# Patient Record
Sex: Male | Born: 1995 | Hispanic: Refuse to answer | Marital: Single | Smoking: Never smoker
Health system: Southern US, Community
[De-identification: ages and names within clinical notes are randomized; demographics above are authoritative.]

---

## 2016-03-10 ENCOUNTER — Ambulatory Visit (INDEPENDENT_AMBULATORY_CARE_PROVIDER_SITE_OTHER): Payer: 59 | Admitting: Family Medicine

## 2016-03-10 ENCOUNTER — Encounter: Payer: Self-pay | Admitting: Family Medicine

## 2016-03-10 VITALS — BP 127/60 | HR 62 | Temp 97.5°F | Resp 14

## 2016-03-10 DIAGNOSIS — J069 Acute upper respiratory infection, unspecified: Secondary | ICD-10-CM

## 2016-03-10 MED ORDER — AZITHROMYCIN 250 MG PO TABS: ORAL_TABLET | ORAL | 0 refills | Status: AC

## 2016-03-10 NOTE — Progress Notes (Signed)
Patient presents today with symptoms of yellow-green mucus from his chest. Patient has been coughing for about the last week. He denies any significant sinus congestion. He denies any fever, chest pain, shortness of breath, severe headache. He has been taking a cough suppressant at bedtime.  ROS: Negative except mentioned above.  Vitals as per Epic.  GENERAL: NAD HEENT: mild pharyngeal erythema, no exudate, no erythema of TMs, no cervical LAD RESP: CTA B CARD: RRR NEURO: CN II-XII grossly intact   A/P: URI - will treat with Z-Pak, Delsym when necessary, Claritin when necessary, rest, hydration, seek medical attention if symptoms persist or worsen as discussed. No athletic activity afebrile.

## 2016-05-19 ENCOUNTER — Encounter: Payer: Self-pay | Admitting: Family Medicine

## 2016-05-19 ENCOUNTER — Ambulatory Visit (INDEPENDENT_AMBULATORY_CARE_PROVIDER_SITE_OTHER): Payer: 59 | Admitting: Family Medicine

## 2016-05-19 DIAGNOSIS — M898X6 Other specified disorders of bone, lower leg: Secondary | ICD-10-CM

## 2016-05-19 DIAGNOSIS — M79662 Pain in left lower leg: Secondary | ICD-10-CM

## 2016-05-19 NOTE — Progress Notes (Signed)
Patient presents today with symptoms of left knee pain. Patient states that he has had the symptoms for about 6-8 weeks. He states that he started to notice some soreness in the area at that time. He denies any injury to the site. This happened while he was playing soccer. He describes it as a soreness that he feels over the area the next day after doing activity. He was able to complete the soccer season and is still training now. He denies there ever being any bruising or swelling of the area. He denies having pain anywhere else. He has taken Ibuprofen for the symptoms.  ROS: Negative except mentioned above. Vitals as per Epic.  GENERAL: NAD MSK: Left knee - no effusion appreciated, full range of motion, no joint line tenderness, negative Lachman negative drawer, no significant laxity with varus or valgus stress, tenderness on the proximal aspect of the fibula, mild tenderness at hamstring attachment attachment at the fibula, no tenderness in the muscle belly of the hamstring NEURO: CN II-XII grossly intact   A/P: Left knee pain - appears to be at proximal aspect of fibula, will do x-rays, given length of time will do MRI as well, will talk with athletic trainer as well.

## 2016-05-22 ENCOUNTER — Ambulatory Visit
Admission: RE | Admit: 2016-05-22 | Discharge: 2016-05-22 | Disposition: A | Discharge status: Home / Self Care | Payer: 59 | Source: Ambulatory Visit | Attending: Family Medicine | Admitting: Family Medicine

## 2016-05-22 DIAGNOSIS — M79662 Pain in left lower leg: Secondary | ICD-10-CM

## 2016-05-22 DIAGNOSIS — M898X6 Other specified disorders of bone, lower leg: Secondary | ICD-10-CM

## 2016-05-22 DIAGNOSIS — M7989 Other specified soft tissue disorders: Secondary | ICD-10-CM | POA: Diagnosis not present

## 2016-05-25 ENCOUNTER — Other Ambulatory Visit: Payer: Self-pay | Admitting: Family Medicine

## 2016-05-25 DIAGNOSIS — M898X6 Other specified disorders of bone, lower leg: Secondary | ICD-10-CM

## 2016-05-25 DIAGNOSIS — M79662 Pain in left lower leg: Secondary | ICD-10-CM

## 2016-05-27 ENCOUNTER — Ambulatory Visit: Payer: 59

## 2016-05-30 ENCOUNTER — Ambulatory Visit
Admission: RE | Admit: 2016-05-30 | Discharge: 2016-05-30 | Disposition: A | Discharge status: Home / Self Care | Payer: 59 | Source: Ambulatory Visit | Attending: Family Medicine | Admitting: Family Medicine

## 2016-05-30 DIAGNOSIS — M79662 Pain in left lower leg: Secondary | ICD-10-CM | POA: Diagnosis present

## 2016-05-30 DIAGNOSIS — M76892 Other specified enthesopathies of left lower limb, excluding foot: Secondary | ICD-10-CM | POA: Insufficient documentation

## 2016-05-30 DIAGNOSIS — M898X6 Other specified disorders of bone, lower leg: Secondary | ICD-10-CM

## 2016-05-30 DIAGNOSIS — R609 Edema, unspecified: Secondary | ICD-10-CM | POA: Insufficient documentation

## 2016-06-01 ENCOUNTER — Other Ambulatory Visit: Payer: Self-pay | Admitting: Family Medicine

## 2016-06-01 MED ORDER — NAPROXEN 500 MG PO TABS: 500.0000 mg | ORAL_TABLET | Freq: Two times a day (BID) | ORAL | 0 refills | Status: AC

## 2016-09-15 ENCOUNTER — Encounter: Payer: Self-pay | Admitting: Family Medicine

## 2016-09-15 ENCOUNTER — Ambulatory Visit (INDEPENDENT_AMBULATORY_CARE_PROVIDER_SITE_OTHER): Payer: 59 | Admitting: Family Medicine

## 2016-09-15 VITALS — BP 119/75 | HR 64 | Temp 97.9°F | Resp 14

## 2016-09-15 DIAGNOSIS — J069 Acute upper respiratory infection, unspecified: Secondary | ICD-10-CM

## 2016-09-15 MED ORDER — AMOXICILLIN-POT CLAVULANATE 875-125 MG PO TABS: 1.0000 | ORAL_TABLET | Freq: Two times a day (BID) | ORAL | 0 refills | Status: AC

## 2016-09-15 NOTE — Progress Notes (Signed)
Patient presents today with symptoms of nasal congestion and productive cough for the last several days. Patient states that his mucus as green in color. He denies any chest pain or shortness of breath. Denies any history of asthma. He has not been taking any medications for his symptoms. He states that it would be difficult for him to run today with soccer practice given his cough. He denies any fever.  ROS: Negative except mentioned above. Vitals as per Epic. GENERAL: NAD HEENT: no pharyngeal erythema, no exudate, no erythema of TMs, no cervical LAD RESP: CTA B CARD: RRR NEURO: CN II-XII grossly intact   A/P: URI - will treat with Augmentin, rest, hydration, Claritin when necessary, Delsym when necessary, seek medical attention if symptoms persist or worsen as discussed. Athletic activity as tolerated today. No athletic activity of febrile as discussed.

## 2017-10-30 IMAGING — CR DG KNEE COMPLETE 4+V*L*
1 series · 4 of 4 positions shown · non-contrast
Comparison: None.

CLINICAL DATA: Left knee pain for 1 month

EXAM:
LEFT KNEE - COMPLETE 4+ VIEW

[Series 1: dg knee complete 4 views left · 0.14mm/px · 4 of 4 slices shown]
[im 1/4]
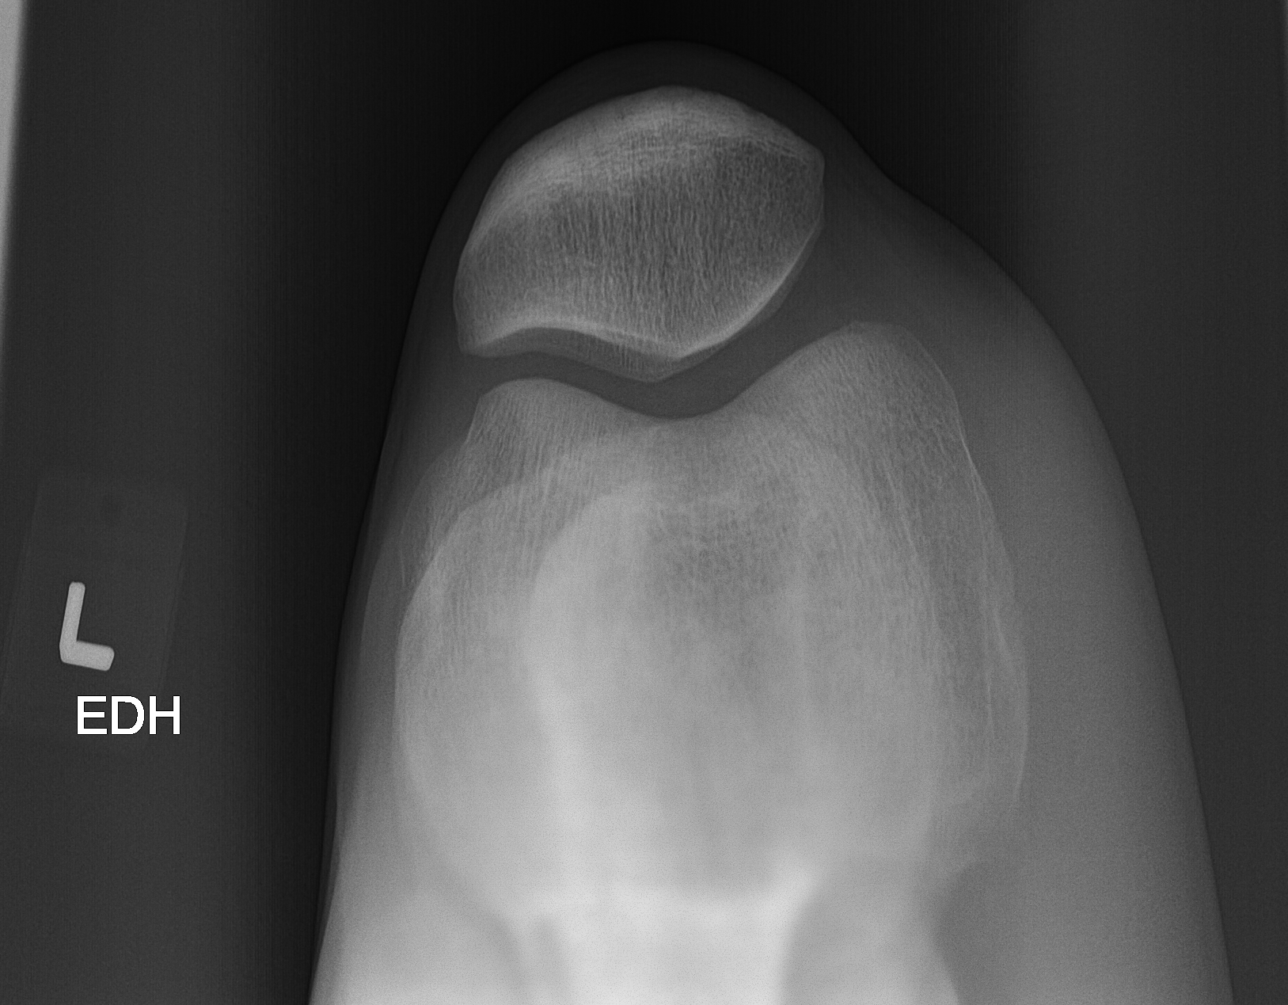
[im 2/4]
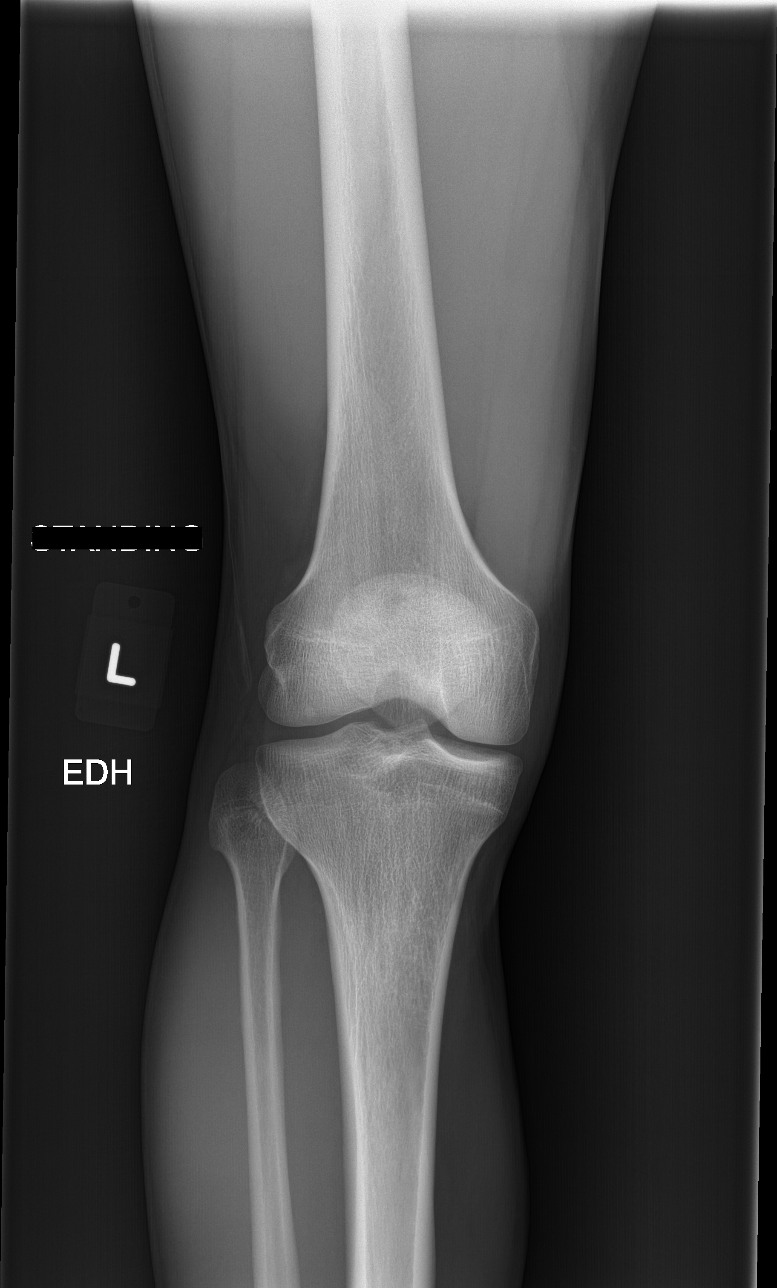
[im 3/4]
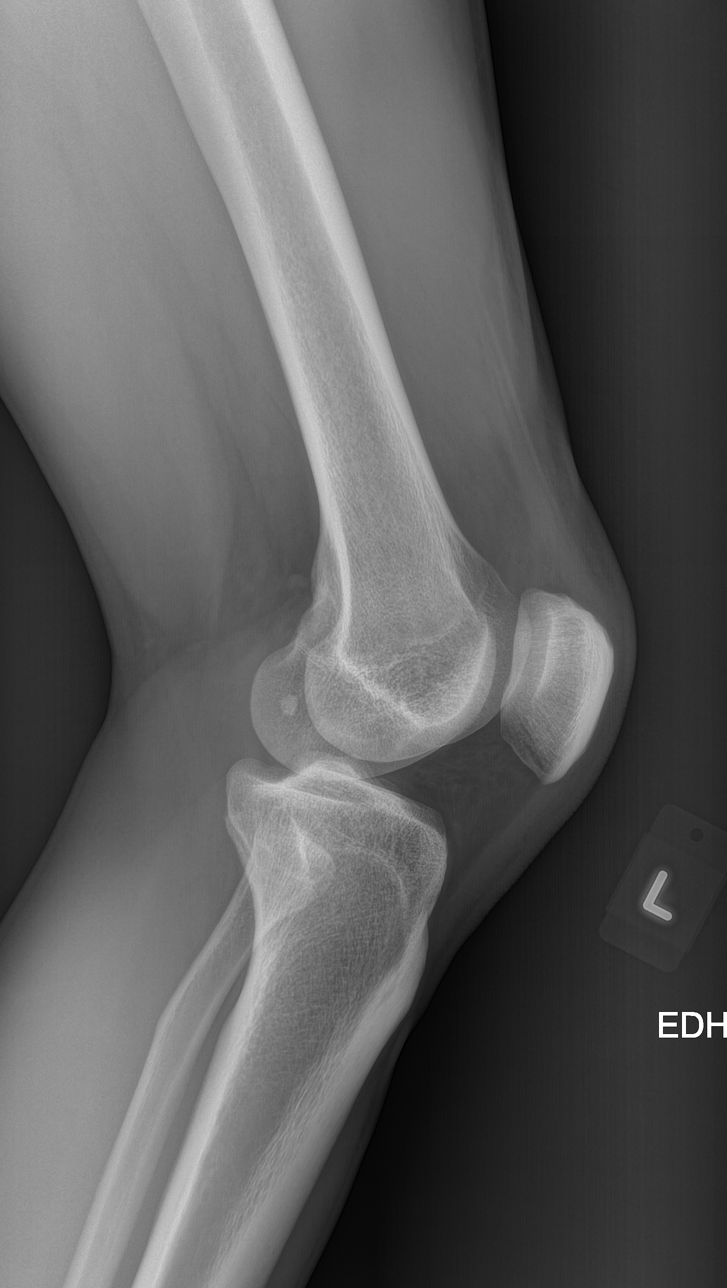
[im 4/4]
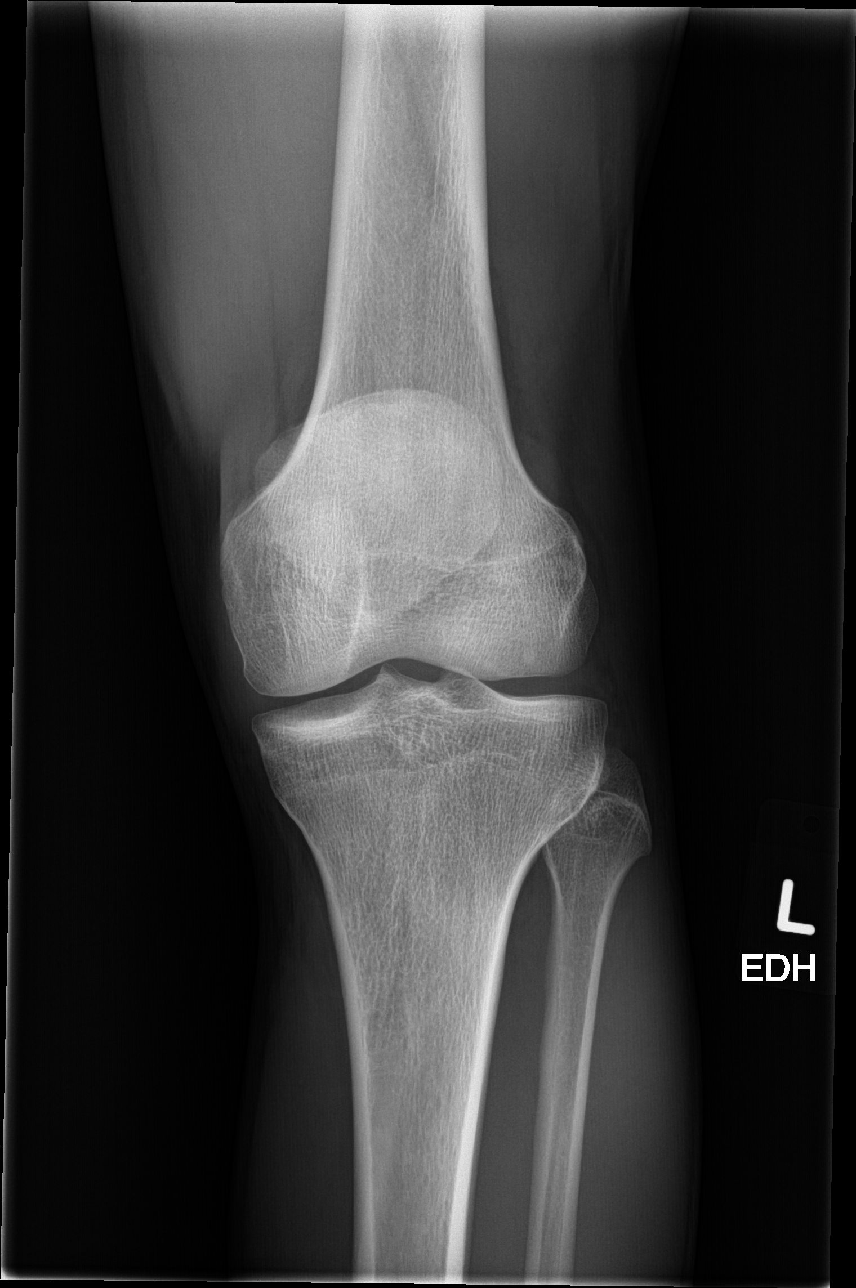

[4 of 4 positions shown; findings below may reference images not displayed]

FINDINGS: Four views of the left knee submitted. No acute fracture or
subluxation mild suprapatellar soft tissue swelling.
IMPRESSION: No acute fracture or subluxation. Mild suprapatellar soft tissue
swelling.
